# Patient Record
Sex: Male | Born: 1985 | Race: Black or African American | Hispanic: No | Marital: Married | Smoking: Never smoker
Health system: Southern US, Community
[De-identification: ages and names within clinical notes are randomized; demographics above are authoritative.]

## PROBLEM LIST (undated history)

## (undated) HISTORY — PX: WISDOM TOOTH EXTRACTION: SHX21

---
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
## 2004-09-02 ENCOUNTER — Ambulatory Visit: Payer: Self-pay | Admitting: Sports Medicine

## 2004-09-04 ENCOUNTER — Ambulatory Visit: Payer: Self-pay | Admitting: Sports Medicine

## 2006-09-22 ENCOUNTER — Ambulatory Visit: Payer: Self-pay | Admitting: Sports Medicine

## 2006-11-25 ENCOUNTER — Ambulatory Visit: Payer: Self-pay | Admitting: Family Medicine

## 2007-07-05 ENCOUNTER — Telehealth: Payer: Self-pay | Admitting: *Deleted

## 2008-05-01 ENCOUNTER — Ambulatory Visit: Payer: Self-pay | Admitting: Family Medicine

## 2008-05-01 ENCOUNTER — Encounter: Payer: Self-pay | Admitting: Family Medicine

## 2008-05-01 DIAGNOSIS — L748 Other eccrine sweat disorders: Secondary | ICD-10-CM

## 2008-05-01 DIAGNOSIS — R030 Elevated blood-pressure reading, without diagnosis of hypertension: Secondary | ICD-10-CM

## 2008-05-01 DIAGNOSIS — L910 Hypertrophic scar: Secondary | ICD-10-CM

## 2008-05-01 LAB — CONVERTED CEMR LAB
ALT: 11 units/L (ref 0–53)
Albumin: 4.8 g/dL (ref 3.5–5.2)
Alkaline Phosphatase: 50 units/L (ref 39–117)
BUN: 10 mg/dL (ref 6–23)
CO2: 26 meq/L (ref 19–32)
Calcium: 10 mg/dL (ref 8.4–10.5)
Hemoglobin: 14.1 g/dL (ref 13.0–17.0)
MCV: 92.5 fL (ref 78.0–100.0)
Platelets: 282 10*3/uL (ref 150–400)
Potassium: 4.7 meq/L (ref 3.5–5.3)
TSH: 1.058 microintl units/mL (ref 0.350–5.50)
Total Bilirubin: 0.4 mg/dL (ref 0.3–1.2)
Triglycerides: 43 mg/dL (ref <150)
WBC: 5.6 10*3/uL (ref 4.0–10.5)

## 2008-05-02 ENCOUNTER — Encounter: Payer: Self-pay | Admitting: Family Medicine

## 2008-10-08 ENCOUNTER — Telehealth: Payer: Self-pay | Admitting: *Deleted

## 2008-10-18 ENCOUNTER — Ambulatory Visit: Payer: Self-pay | Admitting: Family Medicine

## 2008-10-18 DIAGNOSIS — G43909 Migraine, unspecified, not intractable, without status migrainosus: Secondary | ICD-10-CM | POA: Insufficient documentation

## 2008-10-27 ENCOUNTER — Emergency Department (HOSPITAL_COMMUNITY): Admission: EM | Admit: 2008-10-27 | Discharge: 2008-10-27 | Payer: Self-pay | Admitting: Emergency Medicine

## 2009-07-17 IMAGING — CR DG SHOULDER 2+V*L*
3 series · 3 of 3 positions shown · non-contrast
Comparison: None

CLINICAL DATA: Left shoulder pain times 2 days, no known injury.

LEFT SHOULDER - 2+ VIEW

[w shoulder ap internal left]
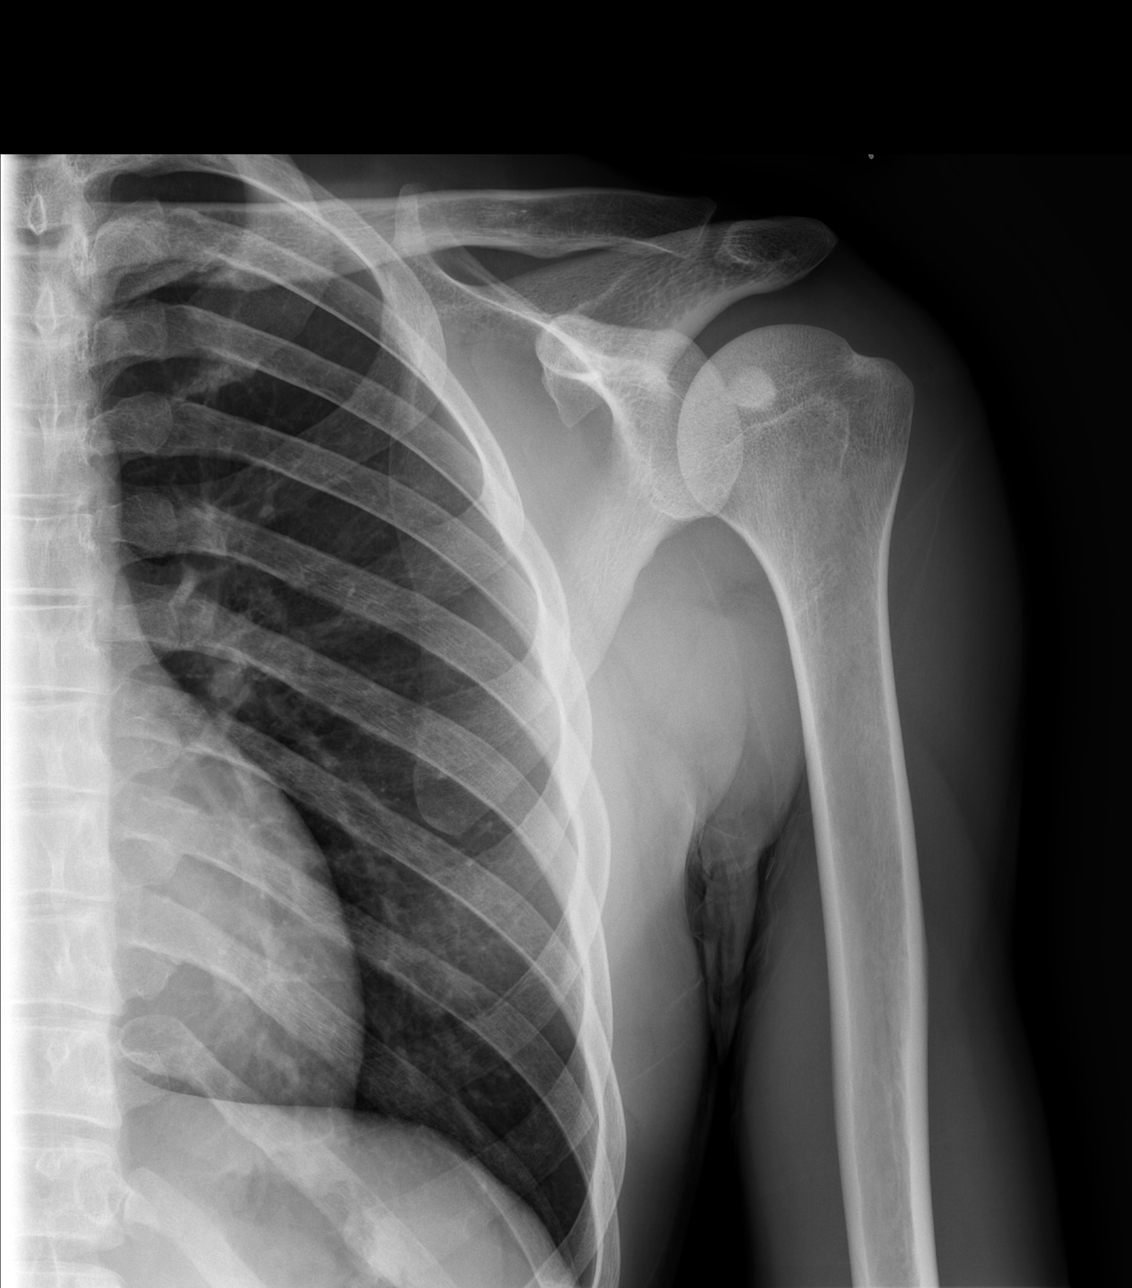

[w shoulder ap external left]
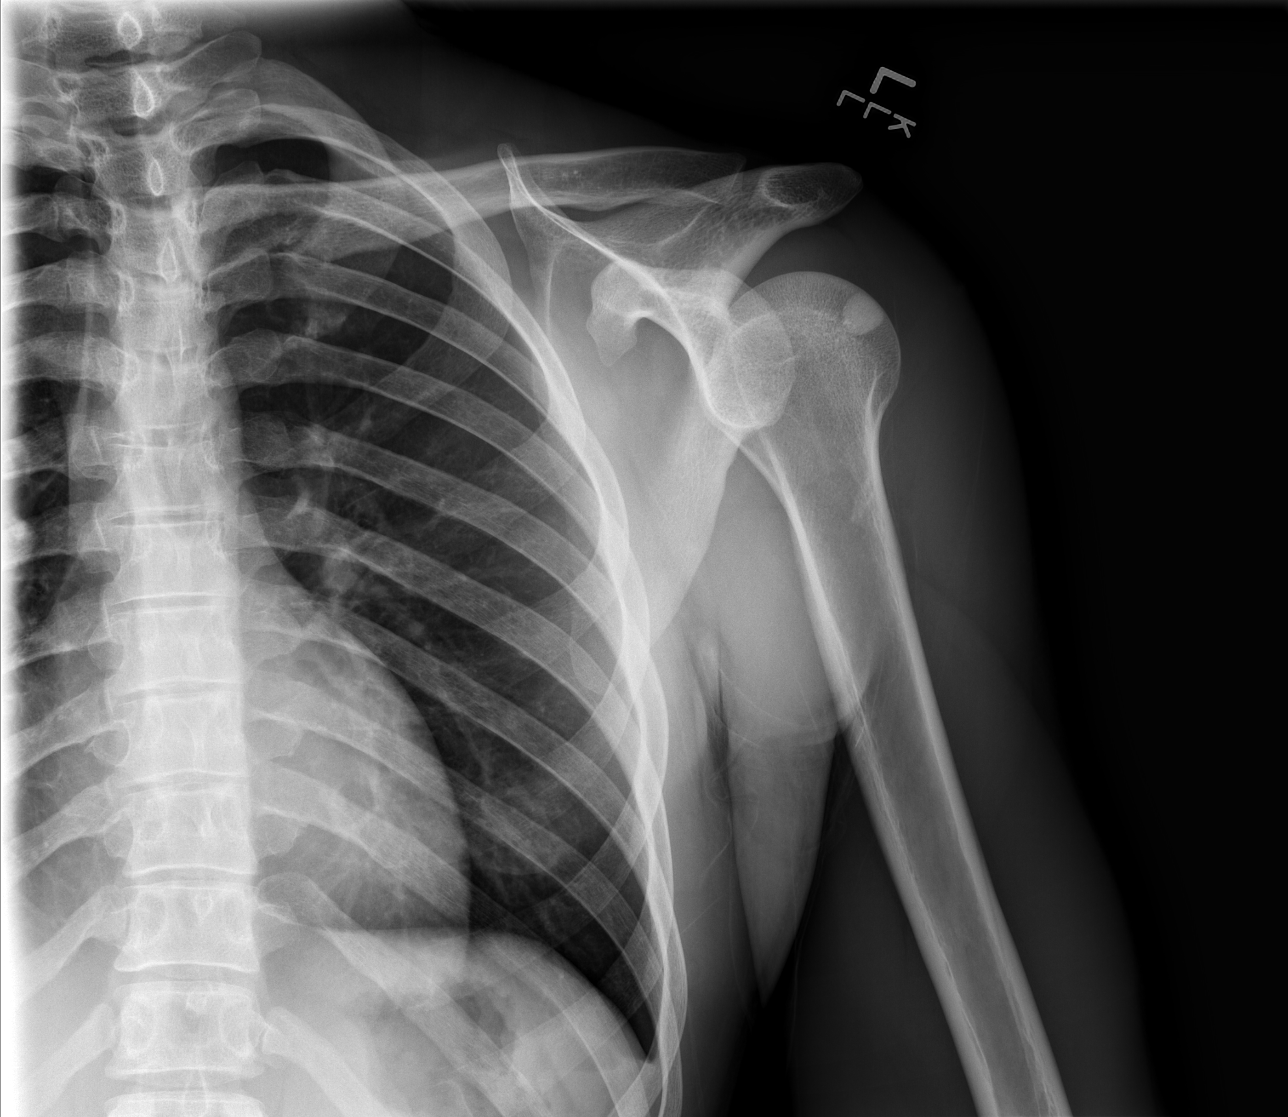

[w shoulder y view left]
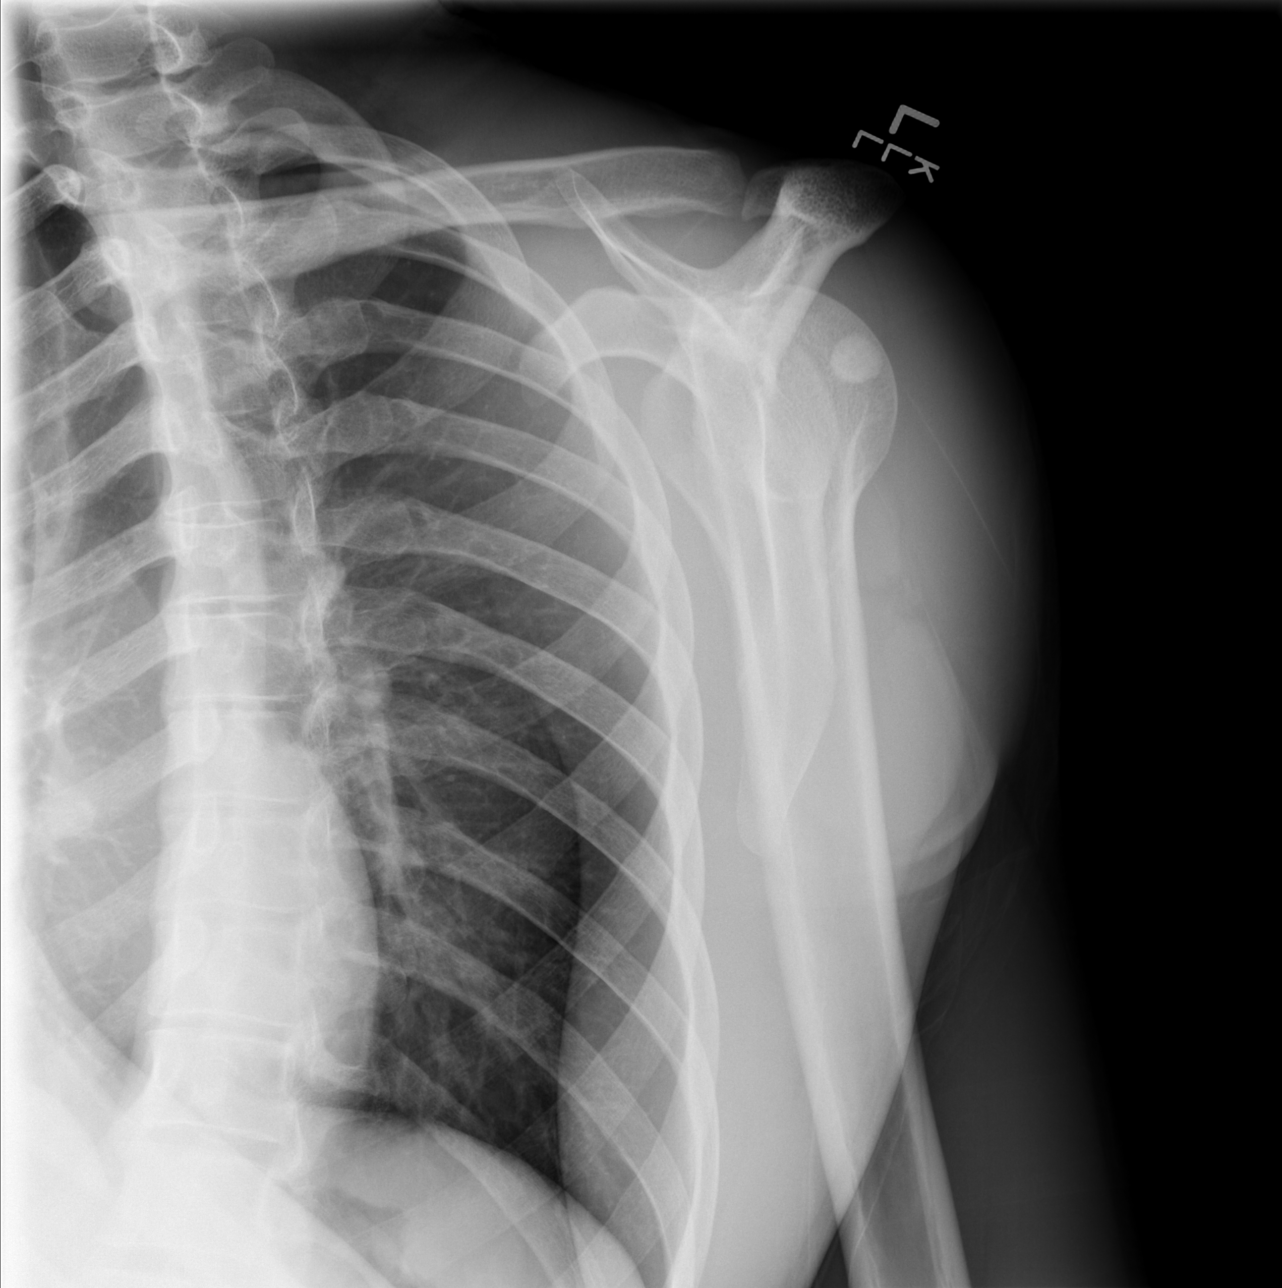

[3 of 3 positions shown; findings below may reference images not displayed]

FINDINGS: No evidence of fracture or dislocation of the left
shoulder.  There is a well-circumscribed densely sclerotic round 14
mm lesion in the head of the humerus which is most consistent with
a benign bone island.
IMPRESSION: 1.  No acute osseous abnormality.
2.  No explanation for left shoulder pain.

## 2009-09-12 ENCOUNTER — Ambulatory Visit: Payer: Self-pay | Admitting: Family Medicine

## 2009-09-24 ENCOUNTER — Encounter (INDEPENDENT_AMBULATORY_CARE_PROVIDER_SITE_OTHER): Payer: Self-pay | Admitting: *Deleted

## 2009-09-24 ENCOUNTER — Encounter: Payer: Self-pay | Admitting: Family Medicine

## 2009-09-24 ENCOUNTER — Ambulatory Visit: Payer: Self-pay | Admitting: Family Medicine

## 2009-09-24 DIAGNOSIS — D485 Neoplasm of uncertain behavior of skin: Secondary | ICD-10-CM | POA: Insufficient documentation

## 2009-09-25 ENCOUNTER — Encounter: Payer: Self-pay | Admitting: Family Medicine

## 2010-03-09 ENCOUNTER — Emergency Department (HOSPITAL_COMMUNITY): Admission: EM | Admit: 2010-03-09 | Discharge: 2010-03-09 | Payer: Self-pay | Admitting: Emergency Medicine

## 2011-02-05 ENCOUNTER — Encounter: Payer: Self-pay | Admitting: Family Medicine

## 2011-02-05 ENCOUNTER — Ambulatory Visit (INDEPENDENT_AMBULATORY_CARE_PROVIDER_SITE_OTHER): Payer: BC Managed Care – PPO | Admitting: Family Medicine

## 2011-02-05 VITALS — BP 136/80 | HR 88 | Ht 72.0 in | Wt 183.6 lb

## 2011-02-05 DIAGNOSIS — Z202 Contact with and (suspected) exposure to infections with a predominantly sexual mode of transmission: Secondary | ICD-10-CM

## 2011-02-05 MED ORDER — AZITHROMYCIN 1 G PO PACK
1.0000 g | PACK | Freq: Once | ORAL | Status: AC
  Administered 2011-02-05: 1 g via ORAL

## 2011-02-05 MED ORDER — CEFTRIAXONE SODIUM 250 MG IJ SOLR
250.0000 mg | Freq: Once | INTRAMUSCULAR | Status: AC
  Administered 2011-02-05: 250 mg via INTRAMUSCULAR

## 2011-02-05 NOTE — Progress Notes (Signed)
  Subjective:    Patient ID: Henry Curry, male    DOB: 20-Feb-1986, 25 y.o.   MRN: [id number]  HPI Sexual activity 2 weeks ago with partner who had Chlamydia per pt; was unprotected per pt. No penile discharge, dysuria. No myalgias, back pain , hematuria, rashes. Pt desires to be checked for any STDs.     Review of Systems     Objective:   Physical Exam Gen: alert, NAD GU: no visible lesions       Assessment & Plan:  Will treat with rocephin 250mg  IM and 1g azithromycin slurry in clinic. Urine GC/Chl, RPR, HIV for diagnostic testing.  Discussed safe sex practices. Pt to follow up prn.

## 2011-02-05 NOTE — Assessment & Plan Note (Signed)
Will treat with rocephin 250mg  IM and 1g azithromycin slurry in clinic. Urine GC/Chl, RPR, HIV for diagnostic testing.  Discussed safe sex practices. Pt to follow up prn.

## 2011-02-05 NOTE — Patient Instructions (Signed)
Sexually Transmitted Disease Sexually transmitted disease (STD) refers to any infection that is passed from person to person during sexual activity. This may happen by way of saliva, semen, blood, vaginal mucus, or urine. Infections are passed in many ways. For example, the common cold could be passed during sexual activity, but it is not considered a sexually transmitted infection. CAUSES Sexual activity is the contact between the genitals of one partner and the genitals, anus, eyes, mouth, or throat of another. These activities include sexual intercourse, the sharing of sex toys, needles, or any other intimate contact of the genitals, mouth, or rectal areas. An STD may be spread by bacteria (germ), virus, or parasite. These small germ-like agents can enter the body and infect the skin and linings of the:  Vagina.  Rectum.   Urethra.   Cervix.  Eyes.   Mouth.  Throat.   HIV/AIDS and hepatitis B infection can also be spread by needles, through the blood. Common STDs include:  Gonorrhea.  Chlamydia.   Syphilis.   HIV/AIDS (human immunodeficiency virus / acquired immunodeficiency syndrome).   Genital herpes.  Hepatitis B.   Trichomonas.   Human papilloma virus (HPV).   Pubic lice.   SYMPTOMS Some people may not have any symptoms, but can still transmit the infection to others.  Painful or bloody urination.   Pain in the pelvis, abdomen, vagina, anus, throat, or eyes. Where it hurts depends on the type of sexual contact.    Skin rash, itching, irritation, growths, or lesions (sores, ulcer). These usually occur in the genital or anal area. These growths may or may not be painful, irritated, or itch.   Abnormal vaginal discharge.   Fever.   Pain or bleeding during sexual intercourse.   Swollen glands in the groin area.   Yellow skin and eyes, seen with hepatitis (jaundice).  RISK FACTORS   Having multiple sex partners.   Having a sex partner who has other sex  partners.   Taking illegal drugs or drinking too much alcohol. This can affect your judgment and put you in a vulnerable position.   Having unprotected sex, not using condoms.   Having open sores on your skin or in your mouth, during sexual activity.   Being involved in high-risk sexual acts.   Engaging in oral or anal sex.Marland Kitchen  DIAGNOSIS  Diagnosis of sexually transmitted infections begins with your caregiver taking your medical history and performing a physical exam. Additional testing may be required.   Cultures are used to diagnose some STDs, including gonorrhea and chlamydia. A specimen is taken, grown in the laboratory for a day or two, and then identified. Newer tests can diagnose certain STDs, such as chlamydia, within minutes.   Trichomonas or syphilis can be seen through a microscope, in a sample of discharge.   Syphilis can be seen through a microscope or diagnosed with a blood test.   HIV and hepatitis B can be diagnosed with a blood test.   Pubic lice, which look like tiny bugs in the pubic hair, can usually be seen with a magnifying glass or a microscope.   The appearance of sores or blisters on the skin is enough to make a diagnosis and begin treatment for genital herpes.   HPV (human papilloma virus) is seen on a Pap test. There are several types of HPV that can cause cervical cancer.   Colposcopy (applying special solution and looking at the cervix with a lighted, magnifying tube) is used to see the problem  better when diagnosing HPV.   Laparoscopy (looking into the pelvis at the male organs, with a lighted tube, through a small incision) can also be used for diagnosis.  TREATMENT  Chlamydia, gonorrhea, trichomonas, and syphilis can be cured with antibiotics (injected, oral, vaginal creams, suppositories). These are medicines that kill germs.   Genital herpes, hepatitis, and HIV cannot be cured. They often can be treated with medicines, to lessen the symptoms.    Genital warts from HPV can be removed with medicine, freezing, electrocautery (burning), or surgery. But the warts may come back.   All sexual partners should be informed, tested, and treated for all STDs.   Surgery can be used for removal of HPV of the cervix, vagina, or vulva.   If you have one STD, you are also at risk for all others. If one is discovered, treatment often will be started to cover other possible infections. This may be done even if other infections are not proven with testing.  HOME CARE INSTRUCTIONS AND PREVENTION  Finish all medicine as prescribed. Incomplete treatment of chlamydia and gonorrhea puts women at risk of becoming sterile (unable to have children). Women are also at risk of having a tubal pregnancy (pregnancy outside the uterus), which can be life threatening. Sterility or tubal pregnancies can occur even in fully treated individuals. Following the prescribed treatment decreases the chance. That is why it is important to finish ALL medicines given to you.   Return immediately if you develop an oral temperature of 100.5 or higher.   Only take over-the-counter or prescription medicines for pain, discomfort, or fever as directed by your caregiver.   Rest and eat a balanced diet with plenty of fluids.   Do not have sex until treatment is completed and you have followed up at your caregiver's office or the clinic to which you were referred. If your diagnosis is confirmed by culture or another method, your recent sexual partners need treatment. This is true even if they are problem free or have a negative culture or evaluation. They also should not have sex until their caregiver says it is ok.  STDs should be checked after treatment.  HIV and hepatitis need frequent blood tests and follow-up examinations. This is to monitor the effects of the infection on your body. Any new or worsening symptoms should be reported to your caregiver.   HPV needs follow-up Pap  tests.   Only use latex condoms and water-soluble lubricants. Do not use vaseline or oils.   Avoid alcohol and illegal drugs, because they can affect your mind, and you may end up not practicing safe sex.   A vaccine is available for HPV and hepatitis. Everyone should get the vaccine.   Avoid risky sex practices that can break the skin, because it makes you more at risk for an STD.   Oral, vaginal ring, patches, hormone injection contraceptives, spermicides, diaphragm, IUD, and cervical caps do not protect against STDs.  PROGNOSIS Long-term effects vary, depending on the type and severity of the STD, and the effectiveness of the treatment. An STD can be treated and cured in one week, one or more months, or an STD and its symptoms can last for many years, or even a lifetime (HIV and hepatitis). STDs can also cause damage to the male organs, chronic pelvic pain, infertility, and recurrences of the STD, especially genital herpes, hepatitis, and HIV.   Trichomonas and pubic lice have few or no long-term effects, other than continued symptoms.  Frequent STDs can cause:   Chronic (ongoing) pelvic pain, or closing of the urethra in the penis.   Chlamydia and gonorrhea can cause:   Infertility.   Chronic pelvic inflammatory disease, and chronic pain.   HPV infections increase a woman's risk of having abnormal cells in her cervix and developing cervical cancer.   HPV causes genital warts, which can come back even after treatment.   Several types of HPV (not warts) cause cervical cancer. HPV is the main cause of cervical cancer.   HIV can progress to AIDS and result in death.    Hepatitis B can cause permanent liver damage, liver cancer, and death.    Syphilis can be cured in the early stages. But in advanced stages, it causes permanent brain and heart damage, and death.   Syphilis during pregnancy, if not treated, can cause:   Deformities.   Death of the baby.  WARNING:You have  been diagnosed with an STD, or you may have an STD. All sexually transmitted infections are contagious. People who have an STD or are being evaluated for a possible STD should not have sexual contact with another person until they receive treatment, until the infection has cleared, or until tests are negative for STD. All STDs can be transmitted to babies before or during birth. Effects of STD infection on babies depend on the infection and the effectiveness of treatment. Effects can include infections, birth defects, and even death. SEEK MEDICAL CARE IF:  You think you have an STD, even if you do not have symptoms. Call your caregiver for evaluation and treatment, if needed.   You think or know your sex partner has an STD.   You have any of the symptoms mentioned above.  Document Released: 01/22/2003 Document Re-Released: 01/26/2010 [facility] Patient Information 2011 [place], [place].

## 2011-02-08 LAB — GC/CHLAMYDIA PROBE AMP, URINE
Chlamydia, Swab/Urine, PCR: POSITIVE — AB
GC Probe Amp, Urine: NEGATIVE

## 2011-02-09 ENCOUNTER — Telehealth: Payer: Self-pay | Admitting: *Deleted

## 2011-02-09 NOTE — Telephone Encounter (Signed)
Please put in order for Henry Curry, Henry Curry

## 2011-02-09 NOTE — Telephone Encounter (Signed)
Left message for patient to return call. He needs to schedule nurse visit for STD Tx.Henry Curry, [name]

## 2011-02-10 ENCOUNTER — Encounter: Payer: Self-pay | Admitting: Family Medicine

## 2011-02-10 ENCOUNTER — Telehealth: Payer: Self-pay | Admitting: Family Medicine

## 2011-02-10 NOTE — Telephone Encounter (Signed)
Please call at around 4:30.  Pt will be able to take your call then.

## 2011-02-10 NOTE — Telephone Encounter (Signed)
Called patient again and left message to return call. Please have patient schedule a nurse visit for STD treatment.Henry Curry, [name]

## 2011-02-12 NOTE — Telephone Encounter (Signed)
Unable to reach patient, faxed information to health department.Gracin Mcpartland, [name]

## 2011-02-12 NOTE — Telephone Encounter (Signed)
Unable to reach patient, faxed results and notification that I was unable to reach patient to health department.Henry Curry, [name]

## 2011-12-23 ENCOUNTER — Other Ambulatory Visit: Payer: Self-pay | Admitting: Family Medicine

## 2023-02-03 ENCOUNTER — Encounter (HOSPITAL_COMMUNITY): Payer: Self-pay

## 2023-02-03 ENCOUNTER — Emergency Department (HOSPITAL_COMMUNITY)
Admission: EM | Admit: 2023-02-03 | Discharge: 2023-02-04 | Disposition: A | Discharge status: Home / Self Care | Attending: Emergency Medicine | Admitting: Emergency Medicine

## 2023-02-03 ENCOUNTER — Emergency Department (HOSPITAL_COMMUNITY)

## 2023-02-03 ENCOUNTER — Other Ambulatory Visit: Payer: Self-pay

## 2023-02-03 DIAGNOSIS — M5442 Lumbago with sciatica, left side: Secondary | ICD-10-CM | POA: Diagnosis not present

## 2023-02-03 DIAGNOSIS — G8929 Other chronic pain: Secondary | ICD-10-CM | POA: Diagnosis not present

## 2023-02-03 DIAGNOSIS — M545 Low back pain, unspecified: Secondary | ICD-10-CM | POA: Diagnosis present

## 2023-02-03 NOTE — ED Triage Notes (Signed)
Pt reports 6 year hx of lower left back pain that radiates down his leg. He reports recent exacerbation that started 3-4 weeks ago.

## 2023-02-03 NOTE — ED Provider Triage Note (Signed)
Emergency Medicine Provider Triage Evaluation Note  Henry Curry , a 37 y.o. male  was evaluated in triage.  Pt complains of low back pain with radicular symptoms going to the left leg.  Patient is active duty Therapist, art.  He states he has been evaluated by [facility] physicians and has been treated as an outpatient with oral medications and physical therapy.  He states his symptoms persist.  He states that at some point one of the therapist/providers recommended an MRI which was denied by command.  Patient is here today visiting family and states that the pain is persisting.  Review of Systems  Positive: As above Negative: As above  Physical Exam  BP (!) 154/99 (BP Location: Right Arm)   Pulse 83   Temp 99.3 F (37.4 C) (Oral)   Resp 14   Ht 5\' 11"  (1.803 m)   Wt 106.6 kg   SpO2 99%   BMI 32.78 kg/m  Gen:   Awake, no distress   Resp:  Normal effort  MSK:   Moves extremities without difficulty  Other:    Medical Decision Making  Medically screening exam initiated at 6:11 PM.  Appropriate orders placed.  Henry Curry was informed that the remainder of the evaluation will be completed by another provider, this initial triage assessment does not replace that evaluation, and the importance of remaining in the ED until their evaluation is complete.     Dorothyann Peng, [place] 02/03/23 [phone]

## 2023-02-04 MED ORDER — KETOROLAC TROMETHAMINE 15 MG/ML IJ SOLN
30.0000 mg | Freq: Once | INTRAMUSCULAR | Status: AC
  Administered 2023-02-04: 30 mg via INTRAMUSCULAR
  Filled 2023-02-04: qty 2

## 2023-02-04 MED ORDER — OXYCODONE-ACETAMINOPHEN 5-325 MG PO TABS
1.0000 | ORAL_TABLET | Freq: Once | ORAL | Status: AC
  Administered 2023-02-04: 1 via ORAL
  Filled 2023-02-04: qty 1

## 2023-02-04 MED ORDER — KETOROLAC TROMETHAMINE 10 MG PO TABS: 10.0000 mg | ORAL_TABLET | Freq: Four times a day (QID) | ORAL | 0 refills | Status: AC | PRN

## 2023-02-04 MED ORDER — LIDOCAINE 5 % EX PTCH
1.0000 | MEDICATED_PATCH | CUTANEOUS | Status: DC
  Administered 2023-02-04: 1 via TRANSDERMAL
  Filled 2023-02-04: qty 1

## 2023-02-04 MED ORDER — DEXAMETHASONE SODIUM PHOSPHATE 10 MG/ML IJ SOLN
10.0000 mg | Freq: Once | INTRAMUSCULAR | Status: AC
  Administered 2023-02-04: 10 mg via INTRAMUSCULAR
  Filled 2023-02-04: qty 1

## 2023-02-04 NOTE — ED Notes (Signed)
CT called to check on status of disk patient has requested.

## 2023-02-04 NOTE — ED Notes (Signed)
CT notified patient has requested disk of the scan from this visit.

## 2023-02-04 NOTE — ED Provider Notes (Signed)
[facility] Provider Note   CSN: [id number] Arrival date & time: 02/03/23  1755     History  Chief Complaint  Patient presents with   Back Pain    Henry Curry is a 37 y.o. male.  37 yo overall healthy and well-appearing male presents to the ED with complaints of chronic left-sided low back pain. He states his symptoms first started in 2018 and his pain is constant in nature. It is described as a sharp pain and he is always at a 3/10 in pain, but today he is at a 9/10. He states it is worse when he sits or goes to bed because his muscles become stiff and over the past year has begun to compensate by leaning to the right side when he walks. He has tried PT, Prednisone, Chiropractic treatments with minimal alleviations in his symptoms. He was prescribed a new muscle relaxer, Methocarbamol, which he states does help his symptoms, but when it wears off his pain returns. He has tried to get an MRI in the past with no success through the [place]. Denies recent trauma, fever, IVDU, bowel/bladder incontinence, extremity numbness or paresthesias, saddle anesthesia.    Back Pain     Home Medications Prior to Admission medications   Medication Sig Start Date End Date Taking? Authorizing Provider  ketorolac (TORADOL) 10 MG tablet Take 1 tablet (10 mg total) by mouth every 6 (six) hours as needed for moderate pain or severe pain (take with meals). 02/04/23  Yes Antonietta Breach, PA-C  SUMAtriptan (IMITREX) 100 MG tablet 100 mg. one tab at the onset of a HA, may repeat in 1 hours if HA is not gone.  Do not exceed 2 tabs in 24 hours.     [provider]      Allergies    Cat hair extract    Review of Systems   Review of Systems  Musculoskeletal:  Positive for back pain.  Ten systems reviewed and are negative for acute change, except as noted in the HPI.    Physical Exam Updated Vital Signs BP (!) 155/87   Pulse 62   Temp 99.2 F (37.3 C)    Resp 16   Ht 5\' 11"  (1.803 m)   Wt 106.6 kg   SpO2 98%   BMI 32.78 kg/m   Physical Exam Vitals and nursing note reviewed.  Constitutional:      General: He is not in acute distress.    Appearance: He is well-developed. He is not diaphoretic.     Comments: Nontoxic appearing male  HENT:     Head: Normocephalic and atraumatic.  Eyes:     General: No scleral icterus.    Conjunctiva/sclera: Conjunctivae normal.  Pulmonary:     Effort: Pulmonary effort is normal. No respiratory distress.     Comments: Respirations even and unlabored Musculoskeletal:        General: Normal range of motion.     Cervical back: Normal range of motion.     Comments: TTP to the left lumbosacral paraspinal muscles. No midline TTP of the lumbosacral spine.  Skin:    General: Skin is warm and dry.     Coloration: Skin is not pale.     Findings: No erythema or rash.  Neurological:     Mental Status: He is alert and oriented to person, place, and time.     Comments: GCS 15. Ambulatory in the department without assistance. Intact and equal sensation and  strength in all extremities.  Psychiatric:        Behavior: Behavior normal.     ED Results / Procedures / Treatments   Labs (all labs ordered are listed, but only abnormal results are displayed) Labs Reviewed - No data to display  EKG None  Radiology CT Lumbar Spine Wo Contrast  Result Date: 02/03/2023 CLINICAL DATA:  Lumbar radiculopathy with symptoms persisting over 6 weeks of treatment. EXAM: CT LUMBAR SPINE WITHOUT CONTRAST TECHNIQUE: Multidetector CT imaging of the lumbar spine was performed without intravenous contrast administration. Multiplanar CT image reconstructions were also generated. RADIATION DOSE REDUCTION: This exam was performed according to the departmental dose-optimization program which includes automated exposure control, adjustment of the mA and/or kV according to patient size and/or use of iterative reconstruction technique.  COMPARISON:  None Available. FINDINGS: Segmentation: 5 lumbar type vertebrae. Alignment: Normal. Vertebrae: No acute fracture or focal pathologic process. Paraspinal and other soft tissues: Negative for perispinal mass or inflammation. Left renal calculus measuring 4 mm. Disc levels: T12- L1: Mild disc height loss and bulging L1-L2: Mild disc height loss and bulging L2-L3: Mild disc height loss and bulging L3-L4: Disc height loss and bulging with central protrusion and buttressing osteophytes. Mild to moderate thecal sac narrowing accentuated by short pedicles L4-L5: Disc narrowing and bulging with downward pointing central protrusion and buttressing osteophytes. Moderate thecal sac narrowing. Underlying short pedicles. L5-S1:Disc narrowing and bulging with endplate ridging. 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 Electronically Signed   By: Jorje Guild M.D.   On: 02/03/2023 20:31    Procedures Procedures    Medications Ordered in ED Medications  lidocaine (LIDODERM) 5 % 1 patch (1 patch Transdermal Patch Applied 02/04/23 0030)  ketorolac (TORADOL) 15 MG/ML injection 30 mg (30 mg Intramuscular Given 02/04/23 0029)  dexamethasone (DECADRON) injection 10 mg (10 mg Intramuscular Given 02/04/23 0029)  oxyCODONE-acetaminophen (PERCOCET/ROXICET) 5-325 MG per tablet 1 tablet (1 tablet Oral Given 02/04/23 0029)    ED Course/ Medical Decision Making/ A&P                             Medical Decision Making Risk Prescription drug management.   This patient presents to the ED for concern of back pain, this involves an extensive number of treatment options, and is a complaint that carries with it a high risk of complications and morbidity.  The differential diagnosis includes lumbar strain vs disc herniation/radiculopathy vs kidney stone vs epidural abscess vs cauda equina   Co morbidities that complicate the patient  evaluation  None    Additional history obtained:  Additional history obtained from spouse, at bedside Attempted to review external records, but none available   Imaging Studies ordered:  I ordered imaging studies including CT lumbar spine  I independently visualized and interpreted imaging which showed degenerative changed with disc protrusion at L3/4 and 4/5 I agree with the radiologist interpretation   Cardiac Monitoring:  The patient was maintained on a cardiac monitor.  I personally viewed and interpreted the cardiac monitored which showed an underlying rhythm of: NSR   Medicines ordered and prescription drug management:  I ordered medication including Toradol, Decadron, Percocet, and Lidoderm for back pain  Reevaluation of the patient after these medicines showed that the patient improved I have reviewed the patients home medicines and have made adjustments as needed   Test Considered:  MR lumbar spine  Problem List / ED Course:  Patient afebrile, neurovascularly intact and ambulatory. Doubt infectious etiology. Chronicity not c/w kidney stone. Denies urinary symptoms. No loss of bowel or bladder control. No concern for cauda equina.   Denies IVDU Given chronicity, will refer to Neurosurgery. Will give Rx for stronger NSAID to use with Robaxin Rx.   Reevaluation:  After the interventions noted above, I reevaluated the patient and found that they have :stayed the same   Dispostion:  After consideration of the diagnostic results and the patients response to treatment, I feel that the patent would benefit from outpatient follow up and Neurosurgery consultation. Referral given. Return precautions discussed and provided. Patient discharged in stable condition with no unaddressed concerns.          Final Clinical Impression(s) / ED Diagnoses Final diagnoses:  Chronic low back pain with left-sided sciatica, unspecified back pain laterality    Rx / DC  Orders ED Discharge Orders          Ordered    Ambulatory referral to Neurosurgery        02/04/23 0028    ketorolac (TORADOL) 10 MG tablet  Every 6 hours PRN        02/04/23 0028              Antonietta Breach, PA-C 02/04/23 0127    Orpah Greek, MD 02/05/23 [phone]

## 2023-02-04 NOTE — Discharge Instructions (Addendum)
Alternate ice and heat to areas of injury 3-4 times per day to limit inflammation and spasm.  Avoid strenuous activity and heavy lifting.  We recommend Toradol for pain in addition to your Robaxin for muscle spasms.  Do not drive or drink alcohol after taking Robaxin as it may make you drowsy and impair your judgment.  We recommend follow-up with a primary care doctor to ensure resolution of symptoms.  You have also been given a referral to Neurosurgery for follow up.  Return to the ED for any new or concerning symptoms.
